# Patient Record
Sex: Female | Born: 1990 | Hispanic: Yes | Marital: Single | Smoking: Never smoker
Health system: Southern US, Community
[De-identification: ages and names within clinical notes are randomized; demographics above are authoritative.]

## PROBLEM LIST (undated history)

## (undated) DIAGNOSIS — Z789 Other specified health status: Secondary | ICD-10-CM

---

## 2017-05-28 ENCOUNTER — Other Ambulatory Visit: Payer: Self-pay | Admitting: Advanced Practice Midwife

## 2017-05-28 DIAGNOSIS — Z369 Encounter for antenatal screening, unspecified: Secondary | ICD-10-CM

## 2017-06-27 ENCOUNTER — Ambulatory Visit
Admission: RE | Admit: 2017-06-27 | Discharge: 2017-06-27 | Disposition: A | Discharge status: Home / Self Care | Payer: Medicaid Other | Source: Ambulatory Visit | Attending: Obstetrics and Gynecology | Admitting: Obstetrics and Gynecology

## 2017-06-27 ENCOUNTER — Ambulatory Visit (HOSPITAL_BASED_OUTPATIENT_CLINIC_OR_DEPARTMENT_OTHER)
Admission: RE | Admit: 2017-06-27 | Discharge: 2017-06-27 | Disposition: A | Discharge status: Home / Self Care | Payer: Medicaid Other | Source: Ambulatory Visit | Attending: Obstetrics and Gynecology | Admitting: Obstetrics and Gynecology

## 2017-06-27 DIAGNOSIS — Z369 Encounter for antenatal screening, unspecified: Secondary | ICD-10-CM

## 2017-06-27 DIAGNOSIS — Z363 Encounter for antenatal screening for malformations: Secondary | ICD-10-CM | POA: Diagnosis not present

## 2017-06-27 DIAGNOSIS — Z3A12 12 weeks gestation of pregnancy: Secondary | ICD-10-CM | POA: Diagnosis not present

## 2017-06-27 HISTORY — DX: Other specified health status: Z78.9

## 2017-06-27 NOTE — Progress Notes (Addendum)
Gloria Nicholson, Gloria Nicholson: 30 minutes   Gloria Nicholson  was [facility] to [facility] for genetic counseling to review prenatal screening and testing options.  This note summarizes the information we discussed.    We offered the following routine screening tests for this pregnancy:  First trimester screening, which includes nuchal translucency ultrasound screen and first trimester maternal serum marker screening.  The nuchal translucency has approximately an 80% detection rate for Down syndrome and can be positive for other chromosome abnormalities as well as congenital heart defects.  When combined with a maternal serum marker screening, the detection rate is up to 90% for Down syndrome and up to 97% for trisomy 18.     Maternal serum marker screening, a blood test that measures pregnancy proteins, can provide risk assessments for Down syndrome, trisomy 18, and open neural tube defects (spina bifida, anencephaly). Because it does not directly examine the fetus, it cannot positively diagnose or rule out these problems.  Targeted ultrasound uses high frequency sound waves to create an image of the developing fetus.  An ultrasound is often recommended as a routine means of evaluating the pregnancy.  It is also used to screen for fetal anatomy problems (for example, a heart defect) that might be suggestive of a chromosomal or other abnormality.   Should these screening tests indicate an increased concern, then the following additional testing options would be offered:  The chorionic villus sampling procedure is available for first trimester chromosome analysis.  This involves the withdrawal of a small amount of chorionic villi (tissue from the developing placenta).  Risk of pregnancy loss is estimated to be approximately 1 in 200 to 1 in 100 (0.5 to 1%).  There is approximately a 1% (1 in 100) chance that the CVS chromosome results will be unclear.  Chorionic  villi cannot be tested for neural tube defects.     Amniocentesis involves the removal of a small amount of amniotic fluid from the sac surrounding the fetus with the use of a thin needle inserted through the maternal abdomen and uterus.  Ultrasound guidance is used throughout the procedure.  Fetal cells from amniotic fluid are directly evaluated and > 99.5% of chromosome problems and > 98% of open neural tube defects can be detected. This procedure is generally performed after the 15th week of pregnancy.  The main risks to this procedure include complications leading to miscarriage in less than 1 in 200 cases (0.5%).  As another option for information if the pregnancy is suspected to be an an increased chance for certain chromosome conditions, we also reviewed the availability of cell free fetal DNA testing from maternal blood to determine whether or not the baby may have either Down syndrome, trisomy 7513, or trisomy 1318.  This test utilizes a maternal blood sample and DNA sequencing technology to isolate circulating cell free fetal DNA from maternal plasma.  The fetal DNA can then be analyzed for DNA sequences that are derived from the three most common chromosomes involved in aneuploidy, chromosomes 13, 18, and 21.  If the overall amount of DNA is greater than the expected level for any of these chromosomes, aneuploidy is suspected.  While we do not consider it a replacement for invasive testing and karyotype analysis, a negative result from this testing would be reassuring, though not a guarantee of a normal chromosome complement for the baby.  An abnormal result is certainly suggestive of an abnormal chromosome complement, though we would still  recommend CVS or amniocentesis to confirm any findings from this testing.  Cystic Fibrosis and Spinal Muscular Atrophy (SMA) screening were also discussed with the patient. Both conditions are recessive, which means that both parents must be carriers in order to have  a child with the disease.  Cystic fibrosis (CF) is one of the most common genetic conditions in persons of Caucasian ancestry.  This condition occurs in approximately 1 in 2,500 Caucasian persons and results in thickened secretions in the lungs, digestive, and reproductive systems.  For a baby to be at risk for having CF, both of the parents must be carriers for this condition.  Approximately 1 in 4 Caucasian persons is a carrier for CF.  Current carrier testing looks for the most common mutations in the gene for CF and can detect approximately 90% of carriers in the Caucasian population.  This means that the carrier screening can greatly reduce, but cannot eliminate, the chance for an individual to have a child with CF.  If an individual is found to be a carrier for CF, then carrier testing would be available for the partner. As part of [place]'s newborn screening profile, all babies born in the state of [place] will have a two-tier screening process.  Specimens are first tested to determine the concentration of immunoreactive trypsinogen (IRT).  The top 5% of specimens with the highest IRT values then undergo DNA testing using a panel of over 40 common CF mutations. SMA is a neurodegenerative disorder that leads to atrophy of skeletal muscle and overall weakness.  This condition is also more prevalent in the Caucasian population, with 1 in 40-1 in 60 persons being a carrier and 1 in 6,000-1 in 10,000 children being affected.  There are multiple forms of the disease, with some causing death in infancy to other forms with survival into adulthood.  The genetics of SMA is complex, but carrier screening can detect up to 95% of carriers in the Caucasian population.  Similar to CF, a negative result can greatly reduce, but cannot eliminate, the chance to have a child with SMA.  We obtained a detailed family history and pregnancy history.  The family history was reported to be unremarkable for birth  defects, intellectual delays, recurrent pregnancy loss or known chromosome abnormalities.  Ms. Meara stated that this is her third pregnancy.  She reported no complications or exposures that would be expected to increase the risk for birth defects.  After consideration of the options, Ms. Cimino elected to proceed with an ultrasound only. She declined formal first trimester screening as well as carrier testing for CF and SMA.  An ultrasound was performed at the time of the visit.  The gestational age was consistent with 12 weeks.  Fetal anatomy could not be assessed due to early gestational age.  Please refer to the ultrasound report for details of that study.  We scheduled her to return to [facility] for an anatomy ultrasound in the second trimester.  Ms. Kuehnel was encouraged to call with questions or concerns.  We can be contacted at [phone].    Cherly Anderson, MS, CGC  Katrina Stack, MS, CGC performed an integral service incident to the physician's initial service.  I was physically present in the clinical area and was immediately available to render assistance.   Mindel Friscia C Amare Bail

## 2017-08-05 ENCOUNTER — Other Ambulatory Visit: Payer: Self-pay | Admitting: *Deleted

## 2017-08-05 DIAGNOSIS — O99212 Obesity complicating pregnancy, second trimester: Secondary | ICD-10-CM

## 2017-08-08 ENCOUNTER — Other Ambulatory Visit: Payer: Self-pay

## 2017-08-12 ENCOUNTER — Ambulatory Visit
Admission: RE | Admit: 2017-08-12 | Discharge: 2017-08-12 | Disposition: A | Discharge status: Home / Self Care | Payer: Medicaid Other | Source: Ambulatory Visit | Attending: Obstetrics and Gynecology | Admitting: Obstetrics and Gynecology

## 2017-08-12 DIAGNOSIS — E669 Obesity, unspecified: Secondary | ICD-10-CM | POA: Insufficient documentation

## 2017-08-12 DIAGNOSIS — O99212 Obesity complicating pregnancy, second trimester: Secondary | ICD-10-CM | POA: Diagnosis present

## 2017-08-12 DIAGNOSIS — Z3A19 19 weeks gestation of pregnancy: Secondary | ICD-10-CM | POA: Diagnosis not present

## 2017-11-18 ENCOUNTER — Other Ambulatory Visit: Payer: Self-pay | Admitting: *Deleted

## 2017-11-18 DIAGNOSIS — O99213 Obesity complicating pregnancy, third trimester: Secondary | ICD-10-CM

## 2017-11-21 ENCOUNTER — Ambulatory Visit
Admission: RE | Admit: 2017-11-21 | Discharge: 2017-11-21 | Disposition: A | Discharge status: Home / Self Care | Payer: Medicaid Other | Source: Ambulatory Visit | Attending: Obstetrics & Gynecology | Admitting: Obstetrics & Gynecology

## 2017-11-21 DIAGNOSIS — O99213 Obesity complicating pregnancy, third trimester: Secondary | ICD-10-CM | POA: Diagnosis not present

## 2017-11-21 DIAGNOSIS — E669 Obesity, unspecified: Secondary | ICD-10-CM | POA: Diagnosis not present

## 2017-11-21 DIAGNOSIS — Z3A33 33 weeks gestation of pregnancy: Secondary | ICD-10-CM | POA: Diagnosis not present

## 2018-02-06 ENCOUNTER — Ambulatory Visit (INDEPENDENT_AMBULATORY_CARE_PROVIDER_SITE_OTHER): Payer: Medicaid Other | Admitting: Women's Health

## 2018-02-06 ENCOUNTER — Encounter: Payer: Self-pay | Admitting: Women's Health

## 2018-02-06 ENCOUNTER — Encounter (INDEPENDENT_AMBULATORY_CARE_PROVIDER_SITE_OTHER): Payer: Self-pay

## 2018-02-06 DIAGNOSIS — Z3009 Encounter for other general counseling and advice on contraception: Secondary | ICD-10-CM

## 2018-02-06 NOTE — Progress Notes (Signed)
   POSTPARTUM VISIT Patient name: Gloria Nicholson MRN [id number]  Date of birth: [birth year] Chief Complaint:   Postpartum Care  History of Present Illness:   Gloria Nicholson is a 27 y.o. [id number] Hispanic female being seen today for a postpartum visit. This is her first appt with us. She is 6 weeks postpartum following a spontaneous vaginal delivery at 38.6 gestational weeks. Anesthesia: epidural. I have fully reviewed the prenatal and intrapartum course. Pregnancy uncomplicated. [facility], [facility] [place] d/t being closest hospital. Lives in [place]. Wanted to come here for pp care b/c this is closer for her.  Postpartum course has been uncomplicated. Bleeding no bleeding. Bowel function is constipation/hemorrhoids, not trying anything to help. Bladder function is normal.  Patient is not sexually active. Last sexual activity: prior to birth of baby.  Contraception method is wants nexplanon.  [place] Postpartum Depression Screening: negative. Score 0.   Last pap unsure.  Results were unsure .  Patient's last menstrual period was 03/31/2017.  Baby's course has been uncomplicated. Baby is feeding by bottle.  Review of Systems:   Pertinent items are noted in HPI Denies Abnormal vaginal discharge w/ itching/odor/irritation, headaches, visual changes, shortness of breath, chest pain, abdominal pain, severe nausea/vomiting, or problems with urination or bowel movements. Pertinent History Reviewed:  Reviewed past medical,surgical, obstetrical and family history.  Reviewed problem list, medications and allergies. OB History  Gravida Para Term Preterm AB Living  3         3  SAB TAB Ectopic Multiple Live Births               # Outcome Date GA Lbr Len/2nd Weight Sex Delivery Anes PTL Lv  3 Current           2 Gravida           1 Gravida            Physical Assessment:   Vitals:   02/06/18 1016  BP: 100/70  Pulse: 98  Weight: 221 lb (100.2 kg)  Body  mass index is 35.67 kg/m.       Physical Examination:   General appearance: alert, well appearing, and in no distress  Mental status: alert, oriented to person, place, and time  Skin: warm & dry   Cardiovascular: normal heart rate noted   Respiratory: normal respiratory effort, no distress   Breasts: deferred, no complaints   Abdomen: soft, non-tender   Pelvic: by Tonia GhentKatie Woods, SNP VULVA: normal appearing vulva with no masses, tenderness or lesions, VAGINA: normal appearing vagina with normal color and discharge, no lesions, UTERUS: uterus is normal size, shape, consistency and nontender  Rectal: small external non-thrombosed hemorrhoids  Extremities: no edema       No results found for this or any previous visit (from the past 24 hour(s)).  Assessment & Plan:  1) Postpartum exam 2) [facility] wks s[facility] [place] 3) Bottlefeeding 4) Depression screening 5) Contraception counseling, pt prefers IUD, abstinence until insertion  Meds: No orders of the defined types were placed in this encounter.   Follow-up: Return for 1st available for IUD insertion.   No orders of the defined types were placed in this encounter.   Cheral MarkerKimberly R Shelia Magallon CNM, [facility] [place]NP-BC 02/06/2018 11:02 AM

## 2018-02-06 NOTE — Patient Instructions (Addendum)
NO SEX UNTIL AFTER YOU GET YOUR BIRTH CONTROL   Constipation  Drink plenty of fluid, preferably water, throughout the day  Eat foods high in fiber such as fruits, vegetables, and grains  Exercise, such as walking, is a good way to keep your bowels regular  Drink warm fluids, especially warm prune juice, or decaf coffee  Eat a 1/2 cup of real oatmeal (not instant), 1/2 cup applesauce, and 1/2-1 cup warm prune juice every day  If needed, you may take Colace (docusate sodium) stool softener once or twice a day to help keep the stool soft. If you are pregnant, wait until you are out of your first trimester (12-14 weeks of pregnancy)  If you still are having problems with constipation, you may take Miralax once daily as needed to help keep your bowels regular.  If you are pregnant, wait until you are out of your first trimester (12-14 weeks of pregnancy)     Levonorgestrel intrauterine device (IUD) What is this medicine? LEVONORGESTREL IUD (LEE voe nor jes trel) is a contraceptive (birth control) device. The device is placed inside the uterus by a healthcare professional. It is used to prevent pregnancy. This device can also be used to treat heavy bleeding that occurs during your period. This medicine may be used for other purposes; ask your health care provider or pharmacist if you have questions. COMMON BRAND NAME(S): Kyleena, LILETTA, Mirena, Skyla What should I tell my health care provider before I take this medicine? They need to know if you have any of these conditions: -abnormal Pap smear -cancer of the breast, uterus, or cervix -diabetes -endometritis -genital or pelvic infection now or in the past -have more than one sexual partner or your partner has more than one partner -heart disease -history of an ectopic or tubal pregnancy -immune system problems -IUD in place -liver disease or tumor -problems with blood clots or take blood-thinners -seizures -use intravenous  drugs -uterus of unusual shape -vaginal bleeding that has not been explained -an unusual or allergic reaction to levonorgestrel, other hormones, silicone, or polyethylene, medicines, foods, dyes, or preservatives -pregnant or trying to get pregnant -breast-feeding How should I use this medicine? This device is placed inside the uterus by a health care professional. Talk to your pediatrician regarding the use of this medicine in children. Special care may be needed. Overdosage: If you think you have taken too much of this medicine contact a poison control center or emergency room at once. NOTE: This medicine is only for you. Do not share this medicine with others. What if I miss a dose? This does not apply. Depending on the brand of device you have inserted, the device will need to be replaced every 3 to 5 years if you wish to continue using this type of birth control. What may interact with this medicine? Do not take this medicine with any of the following medications: -amprenavir -bosentan -fosamprenavir This medicine may also interact with the following medications: -aprepitant -armodafinil -barbiturate medicines for inducing sleep or treating seizures -bexarotene -boceprevir -griseofulvin -medicines to treat seizures like carbamazepine, ethotoin, felbamate, oxcarbazepine, phenytoin, topiramate -modafinil -pioglitazone -rifabutin -rifampin -rifapentine -some medicines to treat HIV infection like atazanavir, efavirenz, indinavir, lopinavir, nelfinavir, tipranavir, ritonavir -St. John's wort -warfarin This list may not describe all possible interactions. Give your health care provider a list of all the medicines, herbs, non-prescription drugs, or dietary supplements you use. Also tell them if you smoke, drink alcohol, or use illegal drugs. Some items may interact   with your medicine. What should I watch for while using this medicine? Visit your doctor or health care professional  for regular check ups. See your doctor if you or your partner has sexual contact with others, becomes HIV positive, or gets a sexual transmitted disease. This product does not protect you against HIV infection (AIDS) or other sexually transmitted diseases. You can check the placement of the IUD yourself by reaching up to the top of your vagina with clean fingers to feel the threads. Do not pull on the threads. It is a good habit to check placement after each menstrual period. Call your doctor right away if you feel more of the IUD than just the threads or if you cannot feel the threads at all. The IUD may come out by itself. You may become pregnant if the device comes out. If you notice that the IUD has come out use a backup birth control method like condoms and call your health care provider. Using tampons will not change the position of the IUD and are okay to use during your period. This IUD can be safely scanned with magnetic resonance imaging (MRI) only under specific conditions. Before you have an MRI, tell your healthcare provider that you have an IUD in place, and which type of IUD you have in place. What side effects may I notice from receiving this medicine? Side effects that you should report to your doctor or health care professional as soon as possible: -allergic reactions like skin rash, itching or hives, swelling of the face, lips, or tongue -fever, flu-like symptoms -genital sores -high blood pressure -no menstrual period for 6 weeks during use -pain, swelling, warmth in the leg -pelvic pain or tenderness -severe or sudden headache -signs of pregnancy -stomach cramping -sudden shortness of breath -trouble with balance, talking, or walking -unusual vaginal bleeding, discharge -yellowing of the eyes or skin Side effects that usually do not require medical attention (report to your doctor or health care professional if they continue or are bothersome): -acne -breast pain -change  in sex drive or performance -changes in weight -cramping, dizziness, or faintness while the device is being inserted -headache -irregular menstrual bleeding within first 3 to 6 months of use -nausea This list may not describe all possible side effects. Call your doctor for medical advice about side effects. You may report side effects to FDA at 1-800-FDA-1088. Where should I keep my medicine? This does not apply. NOTE: This sheet is a summary. It may not cover all possible information. If you have questions about this medicine, talk to your doctor, pharmacist, or health care provider.  2018 Elsevier/Gold Standard (2016-08-10 14:14:56)  

## 2018-02-11 ENCOUNTER — Encounter: Payer: Self-pay | Admitting: *Deleted

## 2018-02-11 ENCOUNTER — Ambulatory Visit: Payer: Medicaid Other | Admitting: Obstetrics and Gynecology

## 2018-02-24 ENCOUNTER — Encounter: Payer: Self-pay | Admitting: *Deleted

## 2018-02-24 ENCOUNTER — Ambulatory Visit: Payer: Medicaid Other | Admitting: Obstetrics and Gynecology

## 2019-06-28 IMAGING — US US MFM OB FOLLOW-UP
1 series · 13 of 28 positions shown · non-contrast
Comparison: none

PATIENT INFO:

PERFORMED BY:
SERVICE(S) PROVIDED:
INDICATIONS:
33 weeks gestation of pregnancy
Obesity
History of previous pregnancy with growth
restriction
FETAL EVALUATION:
Num Of Fetuses:     1
Fetal Heart         157
Rate(bpm):
Cardiac Activity:   Present
Presentation:       Cephalic
Placenta:           Anterior, No previa
Amniotic Fluid
AFI FV:      Within normal limits
AFI Sum(cm)     %Tile       Largest Pocket(cm)
8.6             8
BIOMETRY:
BPD:      86.1  mm     G. Age:  34w 5d         78  %    CI:        74.35   %    70 - 86
FL/HC:      20.0   %    19.4 -
HC:       317   mm     G. Age:  35w 4d         67  %    HC/AC:      1.09        0.96 -
AC:      291.3  mm     G. Age:  33w 1d         40  %    FL/BPD:     73.5   %    71 - 87
FL:       63.3  mm     G. Age:  32w 5d         20  %    FL/AC:      21.7   %    20 - 24
HUM:        57  mm     G. Age:  33w 1d         48  %
Est. FW:    3937  gm    4 lb 13 oz      40  %
GESTATIONAL AGE:
LMP:           33w 4d        Date:  03/31/17                 EDD:   01/05/18
U/S Today:     34w 0d                                        EDD:   01/02/18
Best:          33w 4d     Det. By:  LMP  (03/31/17)          EDD:   01/05/18
ANATOMY:
Cavum:                 Visualized             Ductal Arch:            Normal appearance
previously
Ventricles:            Normal appearance      Diaphragm:              Normal appearance
Cerebellum:            Visualized             Stomach:                Seen
Posterior Fossa:       Visualized             Abdominal Wall:         Visualized
previously                                     previously
Face:                  Orbits visualized      Cord Vessels:           3 vessels,
previously                                     visualized previously
Lips:                  Visualized             Kidneys:                Normal appearance
Heart:                 4-Chamber view         Bladder:                Seen
appears normal
RVOT:                  Normal appearance      Spine:                  Visualized
LVOT:                  Normal appearance      Upper Extremities:      Visualized
Aortic Arch:           Normal appearance      Lower Extremities:      Visualized

[Series 1: us mfm ob follow-up · 0.26mm/px · 13 of 60 slices shown]
[im 3/60]
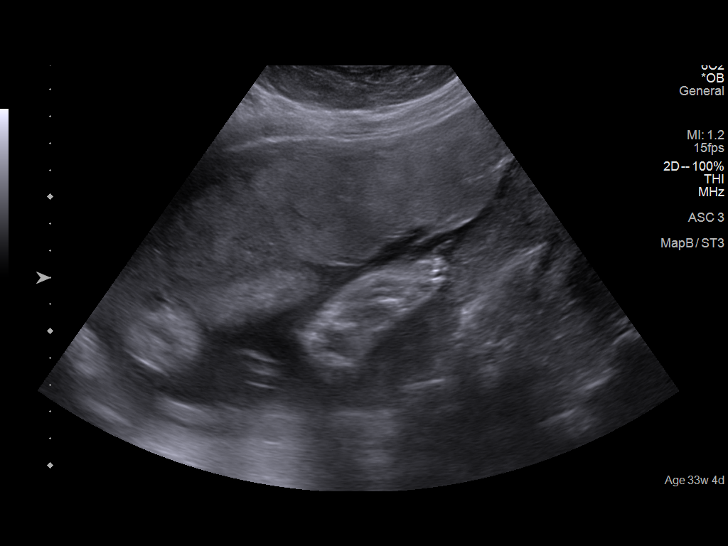
[im 7/60]
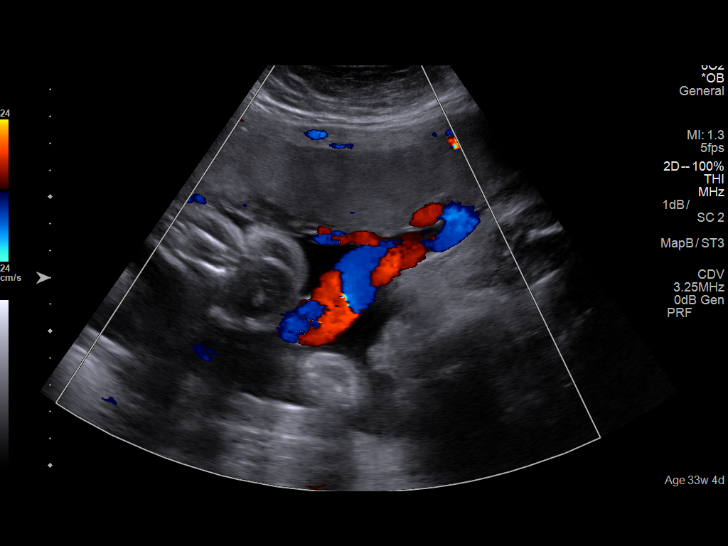
[im 11/60]
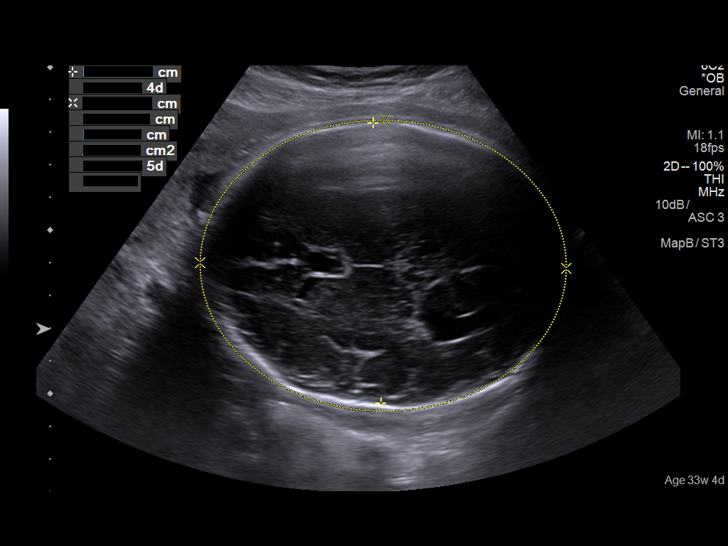
[im 16/60]
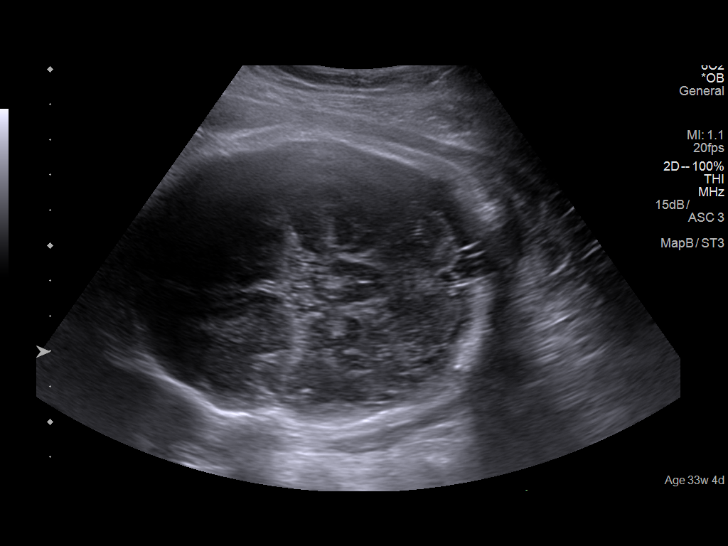
[im 20/60]
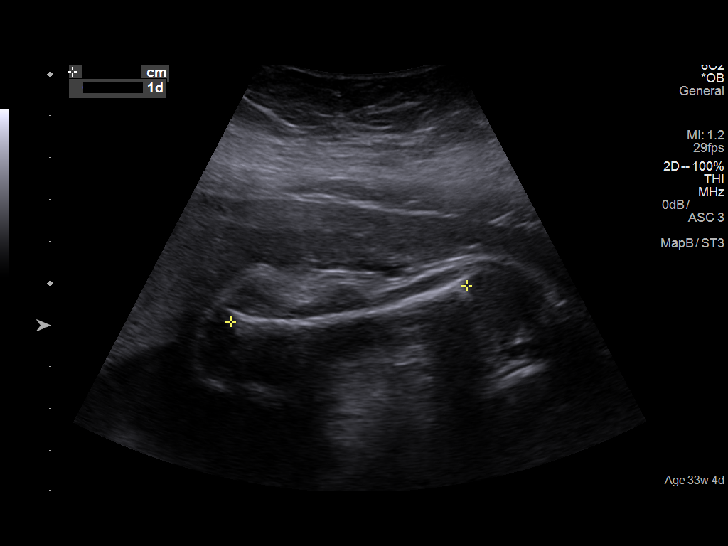
[im 25/60]
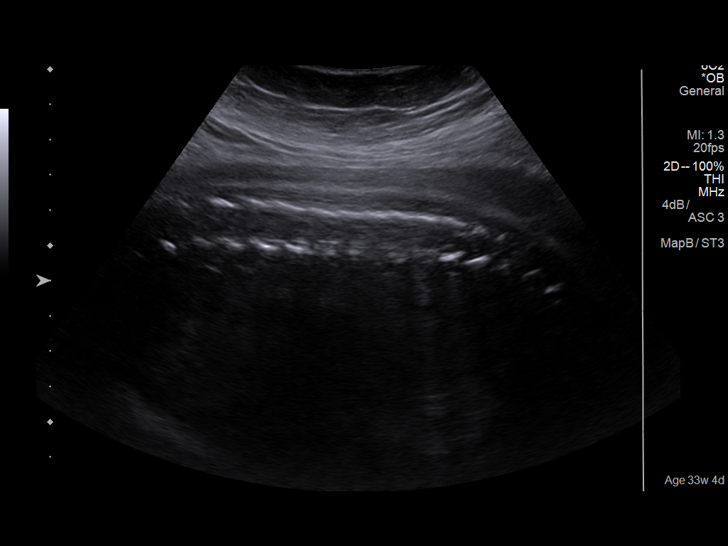
[im 31/60]
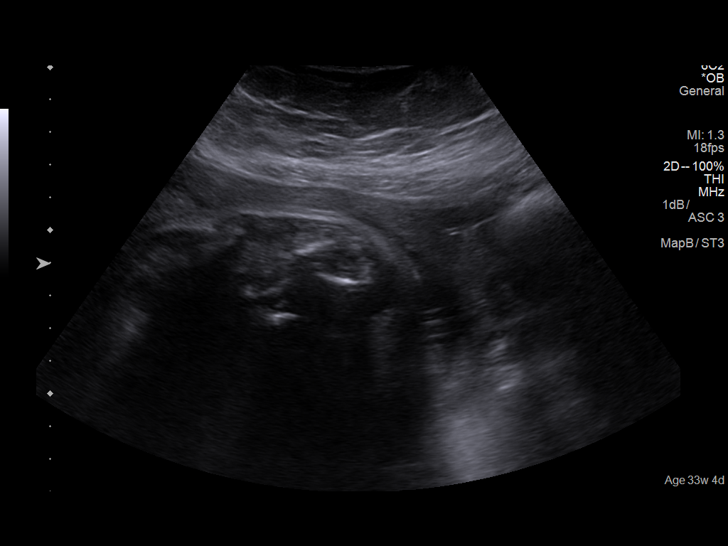
[im 35/60]
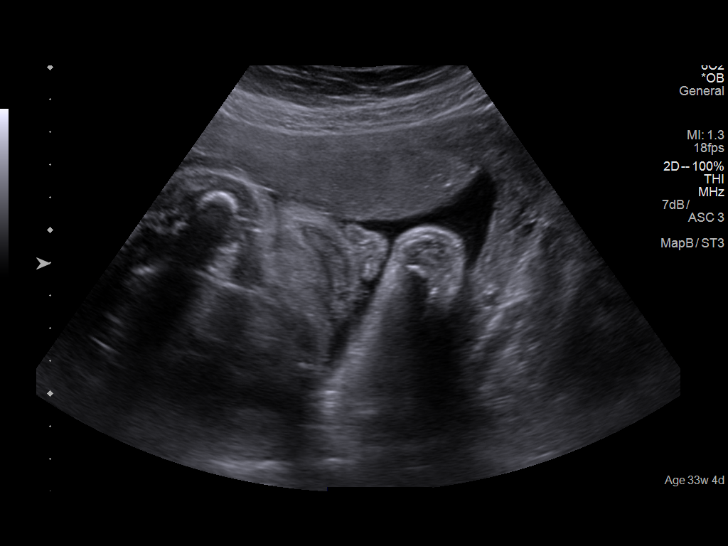
[im 40/60]
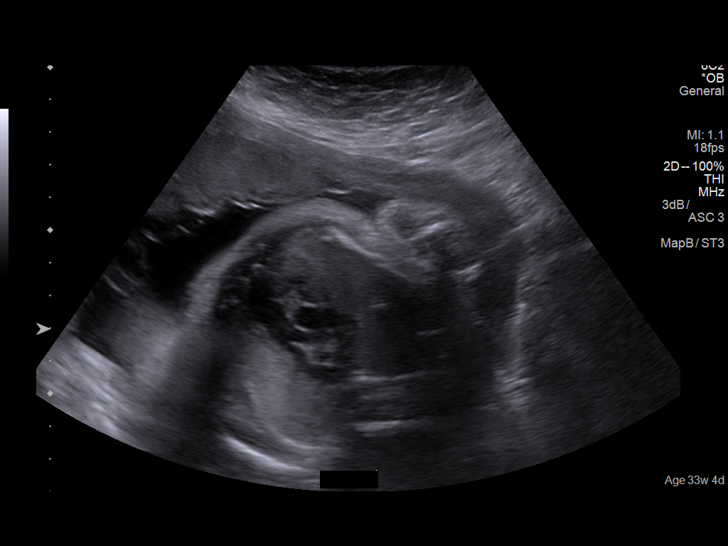
[im 44/60]
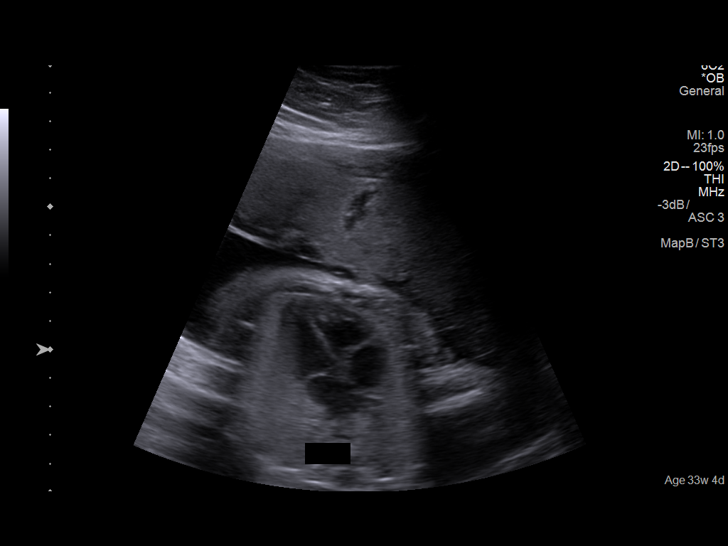
[im 49/60]
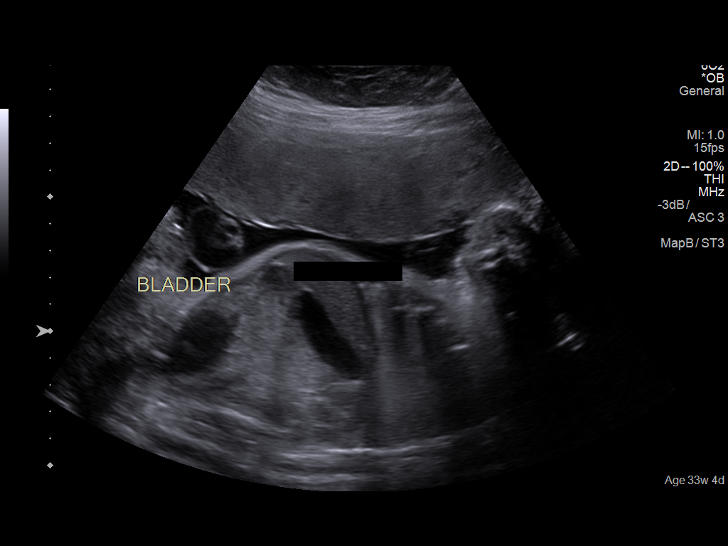
[im 53/60]
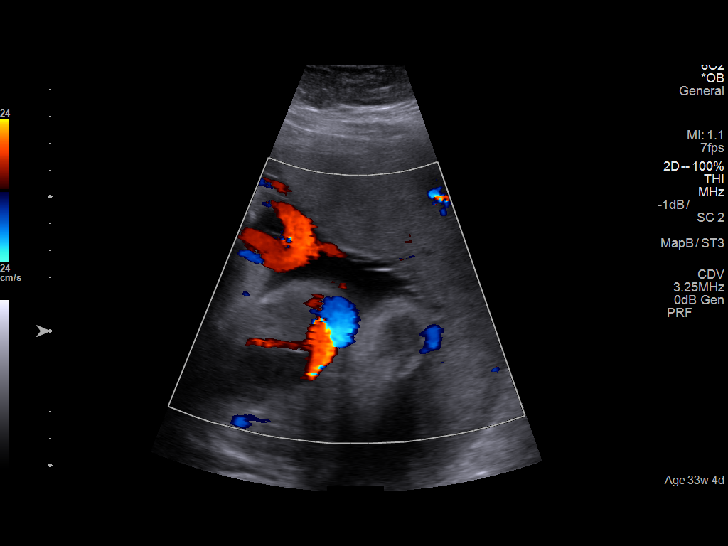
[im 57/60]
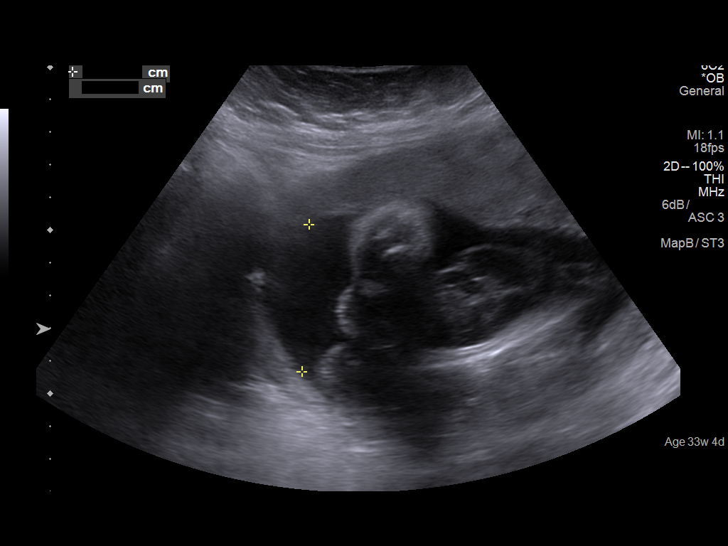

[13 of 28 positions shown; findings below may reference images not displayed]

IMPRESSION: Ultrasound demonstrates a single live pregnancy at 33 [DATE]
weeks. Dating is by LMP consistent with earliest available
ultrasound, performed at [facility] [HOSPITAL] on
06/27/17, with measurements of 12 [DATE] weeks.

The fetal growth is appropriate.  The visualized fetal anatomy
appears normal or was documented as such on prior scan.
The amniotic fluid volume is normal.

## 2023-01-01 LAB — OB RESULTS CONSOLE RPR: RPR: NONREACTIVE

## 2023-01-01 LAB — OB RESULTS CONSOLE ABO/RH: RH Type: POSITIVE

## 2023-01-01 LAB — OB RESULTS CONSOLE HIV ANTIBODY (ROUTINE TESTING): HIV: NONREACTIVE

## 2023-01-01 LAB — OB RESULTS CONSOLE RUBELLA ANTIBODY, IGM: Rubella: IMMUNE

## 2023-01-01 LAB — OB RESULTS CONSOLE HGB/HCT, BLOOD: Hemoglobin: 14.9

## 2023-01-01 LAB — OB RESULTS CONSOLE VARICELLA ZOSTER ANTIBODY, IGG: Varicella: IMMUNE

## 2023-01-16 ENCOUNTER — Ambulatory Visit (INDEPENDENT_AMBULATORY_CARE_PROVIDER_SITE_OTHER): Payer: Medicaid Other

## 2023-01-16 VITALS — BP 113/66 | HR 71 | Ht 66.0 in | Wt 211.4 lb

## 2023-01-16 DIAGNOSIS — Z3481 Encounter for supervision of other normal pregnancy, first trimester: Secondary | ICD-10-CM | POA: Diagnosis not present

## 2023-01-16 DIAGNOSIS — Z3480 Encounter for supervision of other normal pregnancy, unspecified trimester: Secondary | ICD-10-CM | POA: Diagnosis not present

## 2023-01-16 DIAGNOSIS — Z3A01 Less than 8 weeks gestation of pregnancy: Secondary | ICD-10-CM | POA: Diagnosis not present

## 2023-01-16 DIAGNOSIS — O3680X0 Pregnancy with inconclusive fetal viability, not applicable or unspecified: Secondary | ICD-10-CM

## 2023-01-16 MED ORDER — BLOOD PRESSURE KIT DEVI: 1.0000 | 0 refills | Status: DC

## 2023-01-16 MED ORDER — GOJJI WEIGHT SCALE MISC: 1.0000 | 0 refills | Status: DC

## 2023-01-16 NOTE — Progress Notes (Signed)
New OB Intake  I connected with Gloria Nicholson  on 01/16/23 at 10:15 AM EST by in person and verified that I am speaking with the correct person using two identifiers. Nurse is located at [facility] and pt is located at [place].  I discussed the limitations, risks, security and privacy concerns of performing an evaluation and management service by telephone and the availability of in person appointments. I also discussed with the patient that there may be a patient responsible charge related to this service. The patient expressed understanding and agreed to proceed.  I explained I am completing New OB Intake today. We discussed EDD of 09/03/23 that is based on first trimester u/s. Pt is G4/P3003. I reviewed her allergies, medications, Medical/Surgical/OB history, and appropriate screenings. I informed her of [facility] services. [facility] information placed in AVS. Based on history, this is a low risk pregnancy.  There are no problems to display for this patient.   Concerns addressed today  Delivery Plans Plans to deliver at [facility]. Patient given information for [facility] Healthy Baby website for more information about Women's and [place]. Patient is not interested in water birth. Offered upcoming OB visit with CNM to discuss further.  MyChart/Babyscripts MyChart access verified. I explained pt will have some visits in office and some virtually. Babyscripts instructions given and order placed. Patient verifies receipt of registration text/e-mail. Account successfully created and app downloaded.  Blood Pressure Cuff/Weight Scale Blood pressure cuff ordered for patient to pick-up from [employer]. Explained after first prenatal appt pt will check weekly and document in 33. Patient does not have weight scale; order sent to [facility], patient may track weight weekly in Babyscripts.  Anatomy US Explained first scheduled [place] will be around 19 weeks. Dating and viability scan performed  today. Anatomy US TBD.   Labs Discussed Johnsie Cancel genetic screening with patient. Would like both Panorama and Horizon drawn at new OB visit. Routine prenatal labs needed.  COVID Vaccine Patient has had COVID vaccine.   Is patient a CenteringPregnancy candidate?  Not a Candidate Declined due to Group setting Not a candidate due to  If accepted,    Social Determinants of Health Food Insecurity: Patient denies food insecurity. WIC Referral: Patient is interested in referral to [facility].  Transportation: Patient denies transportation needs. Childcare: Discussed no children allowed at ultrasound appointments. Offered childcare services; patient declines childcare services at this time.  Interested in [place]? If yes, send referral.   First visit review I reviewed new OB appt with patient. I explained they will have a provider visit that includes discuss plan of care with provider. Explained pt will be seen by Baltazar Najjar at first visit; encounter routed to appropriate provider. Explained that patient will be seen by pregnancy navigator following visit with provider.   Gloria Lei, RN 01/16/2023  10:31 AM

## 2023-02-20 ENCOUNTER — Ambulatory Visit (INDEPENDENT_AMBULATORY_CARE_PROVIDER_SITE_OTHER): Payer: Medicaid Other | Admitting: Obstetrics

## 2023-02-20 ENCOUNTER — Encounter: Payer: Self-pay | Admitting: Obstetrics

## 2023-02-20 ENCOUNTER — Ambulatory Visit (INDEPENDENT_AMBULATORY_CARE_PROVIDER_SITE_OTHER): Payer: Medicaid Other | Admitting: Licensed Clinical Social Worker

## 2023-02-20 VITALS — BP 115/67 | HR 79 | Wt 214.9 lb

## 2023-02-20 DIAGNOSIS — J301 Allergic rhinitis due to pollen: Secondary | ICD-10-CM

## 2023-02-20 DIAGNOSIS — Z3481 Encounter for supervision of other normal pregnancy, first trimester: Secondary | ICD-10-CM

## 2023-02-20 DIAGNOSIS — Z3480 Encounter for supervision of other normal pregnancy, unspecified trimester: Secondary | ICD-10-CM

## 2023-02-20 DIAGNOSIS — Z3A13 13 weeks gestation of pregnancy: Secondary | ICD-10-CM

## 2023-02-20 MED ORDER — GOJJI WEIGHT SCALE MISC
1.0000 | 0 refills | Status: AC
Start: 2023-02-20 — End: ?

## 2023-02-20 MED ORDER — LORATADINE 10 MG PO TABS: 10.0000 mg | ORAL_TABLET | Freq: Every day | ORAL | 11 refills | Status: AC

## 2023-02-20 MED ORDER — BLOOD PRESSURE KIT DEVI
1.0000 | 0 refills | Status: AC
Start: 2023-02-20 — End: ?

## 2023-02-20 NOTE — Addendum Note (Signed)
Addended by: Natale Milch D on: 02/20/2023 02:47 PM   Modules accepted: Orders

## 2023-02-20 NOTE — Addendum Note (Signed)
Addended by: Natale Milch D on: 02/20/2023 04:00 PM   Modules accepted: Orders

## 2023-02-20 NOTE — Progress Notes (Signed)
Subjective:does    Gloria Nicholson is being seen today for her first obstetrical visit.  This is not a planned pregnancy. She is at 5965w2d gestation. Her obstetrical history is significant for  none . Relationship with FOB: significant other, not living together. Patient does intend to breast feed. Pregnancy history fully reviewed.  The information documented in the HPI was reviewed and verified.  Menstrual History: OB History     Gravida  4   Para  3   Term  3   Preterm      AB      Living  3      SAB      IAB      Ectopic      Multiple      Live Births  3            Patient's last menstrual period was 11/19/2022.    Past Medical History:  Diagnosis Date   Medical history non-contributory     History reviewed. No pertinent surgical history.  (Not in a hospital admission)  No Known Allergies  Social History   Tobacco Use   Smoking status: Never   Smokeless tobacco: Never  Substance Use Topics   Alcohol use: Not Currently    Comment: occ prior to pregnancy    Family History  Problem Relation Age of Onset   Diabetes Father    Hypertension Father      Review of Systems Constitutional: negative for weight loss Gastrointestinal: negative for vomiting Genitourinary:negative for genital lesions and vaginal discharge and dysuria Musculoskeletal:negative for back pain Behavioral/Psych: negative for abusive relationship, depression, illegal drug usage and tobacco use    Objective:    BP 115/67   Pulse 79   Wt 214 lb 14.4 oz (97.5 kg)   LMP 11/19/2022   BMI 34.69 kg/m  General Appearance:    Alert, cooperative, no distress, appears stated age  Head:    Normocephalic, without obvious abnormality, atraumatic  Eyes:    PERRL, conjunctiva/corneas clear, EOM's intact, fundi    benign, both eyes  Ears:    Normal TM's and external ear canals, both ears  Nose:   Nares normal, septum midline, mucosa normal, no drainage    or sinus tenderness  Throat:    Lips, mucosa, and tongue normal; teeth and gums normal  Neck:   Supple, symmetrical, trachea midline, no adenopathy;    thyroid:  no enlargement/tenderness/nodules; no carotid   bruit or JVD  Back:     Symmetric, no curvature, ROM normal, no CVA tenderness  Lungs:     Clear to auscultation bilaterally, respirations unlabored  Chest Wall:    No tenderness or deformity   Heart:    Regular rate and rhythm, S1 and S2 normal, no murmur, rub   or gallop  Breast Exam:    No tenderness, masses, or nipple abnormality  Abdomen:     Soft, non-tender, bowel sounds active all four quadrants,    no masses, no organomegaly  Genitalia:    Normal female without lesion, discharge or tenderness  Extremities:   Extremities normal, atraumatic, no cyanosis or edema  Pulses:   2+ and symmetric all extremities  Skin:   Skin color, texture, turgor normal, no rashes or lesions  Lymph nodes:   Cervical, supraclavicular, and axillary nodes normal  Neurologic:   CNII-XII intact, normal strength, sensation and reflexes    throughout      Lab Review Urine pregnancy test Labs reviewed yes Radiologic  studies reviewed no  Assessment:    Pregnancy at [redacted]w[redacted]d weeks    Plan:   1. Supervision of other normal pregnancy, antepartum Rx: - Panorama Prenatal Test Full Panel - HORIZON Basic Panel - [place] MFM OB DETAIL +14 WK; Future  2. Seasonal allergic rhinitis due to pollen Rx: - loratadine (CLARITIN) 10 MG tablet; Take 1 tablet (10 mg total) by mouth daily.  Dispense: 30 tablet; Refill: 11    Prenatal vitamins.  Counseling provided regarding continued use of seat belts, cessation of alcohol consumption, smoking or use of illicit drugs; infection precautions i.e., influenza/TDAP immunizations, toxoplasmosis,CMV, parvovirus, listeria and varicella; workplace safety, exercise during pregnancy; routine dental care, safe medications, sexual activity, hot tubs, saunas, pools, travel, caffeine use, fish and methlymercury,  potential toxins, hair treatments, varicose veins Weight gain recommendations per IOM guidelines reviewed: underweight/BMI< 18.5--> gain 28 - 40 lbs; normal weight/BMI 18.5 - 24.9--> gain 25 - 35 lbs; overweight/BMI 25 - 29.9--> gain 15 - 25 lbs; obese/BMI >30->gain  11 - 20 lbs Problem list reviewed and updated. FIRST/CF mutation testing/NIPT/QUAD SCREEN/fragile X/Ashkenazi Jewish population testing/Spinal muscular atrophy discussed: requested. Role of ultrasound in pregnancy discussed; fetal survey: requested. Amniocentesis discussed: not indicated.  Meds ordered this encounter  Medications   loratadine (CLARITIN) 10 MG tablet    Sig: Take 1 tablet (10 mg total) by mouth daily.    Dispense:  30 tablet    Refill:  11   Orders Placed This Encounter  Procedures   [place] MFM OB DETAIL +14 WK    Standing Status:   Future    Standing Expiration Date:   02/20/2024    Order Specific Question:   Reason for Exam (SYMPTOM  OR DIAGNOSIS REQUIRED)    Answer:   Anatomy    Order Specific Question:   Preferred Location    Answer:   [facility]   Panorama Prenatal Test Full Panel    ==========Department Information========== ID: [id number] Department:[facility] AT [facility] [address] [place] [address] Dept: [phone] Dept Fax: [phone]     Order Specific Question:   Expected due date (MM/DD/YYYY):    Answer:   08/26/2023    Order Specific Question:   Is this a twin pregnancy? (viable, no vanished twin)    Answer:   No    Order Specific Question:   Is this a surrogate or egg donor pregnancy?    Answer:   No    Order Specific Question:   I want fetal sex included in the report:    Answer:   Yes    Order Specific Question:   Maternal Weight (lbs):    Answer:   16    Order Specific Question:   Which Microdeletion Panel should be ordered?    Answer:   22q11.2 Deletion    Order Specific  Question:   What type of billing?    Answer:   Furniture conservator/restorer Question:   By placing this electronic order I confirm the testing ordered herein is medically necessary and this patient has been informed of the details of the genetic test(s) ordered, including the risks, benefits, and alternatives, and has consented to testing.    Answer:   Yes    Order Specific Question:   Select an order diagnosis: For additional options refer to [email or web address].[email or web address]/  Answer:   Encounter for supervision of other normal pregnancy in first trimester [1509661]   HORIZON Basic Panel    ==========Department Information========== ID: [id number] Department:[facility]ENTER FOR [facility]ALTHCARE AT [facility] [address] [place] [address] Dept: [phone] Dept Fax: [phone]     Order Specific Question:   Specify the name or ID of a valid Horizon Custom Panel:    Answer:   HBASIC    Order Specific Question:   Is patient pregnant?    Answer:   Yes    Order Specific Question:   Ethnicity of patient:    Answer:   Hispanic    Order Specific Question:   Practice ensures that HIPAA consent is obtained and will make available to [facility] upon request?    Answer:   Yes    Order Specific Question:   By placing this electronic order I confirm the testing ordered herein is medically necessary and this patient has been informed of the details of the genetic test(s) ordered, including the risks, benefits, and alternatives, and has consented to testing.    Answer:   Yes    Order Specific Question:   What type of billing?    Answer:   [employer] Specific Question:   Select an order diagnosis: For additional options refer to [email or web address].[email or web address]/    Answer:   Encounter for supervision of other normal pregnancy in second trimester [1660630]    Order Specific Question:    Tay-Sachs add-on test?    Answer:   No    Follow up in 4 weeks.  I have spent a total of 20 minutes of face-to-face time, excluding clinical staff time, reviewing notes and preparing to see patient, ordering tests and/or medications, and counseling the patient.   Brock Bad, MD 02/20/2023 2:43 PM

## 2023-02-20 NOTE — Progress Notes (Addendum)
NOB in office, transfer from [facility]. Pt has no complaints today

## 2023-02-22 LAB — CULTURE, OB URINE

## 2023-02-22 LAB — URINE CULTURE, OB REFLEX

## 2023-02-25 NOTE — BH Specialist Note (Signed)
Integrated Behavioral Health Initial In-Person Visit  MRN: [id number] Name: Gloria Nicholson  Number of Integrated Behavioral Health Clinician visits: 1 Session Start time:   1:30pm Session End time: 1:45pm Total time in minutes: 15 mins at [facility]   Types of Service: General Behavioral Integrated Care (BHI)  Interpretor:No. Interpretor Name and Language: none   Warm Hand Off Completed.        Subjective: Gloria Nicholson is a 32 y.o. female accompanied by Partner/Significant Other Patient was referred by Dr Clearance Coots for new ob intro. Patient reports the following symptoms/concerns: no reported concerns  Duration of problem: n/a; Severity of problem: n/a  Objective: Mood: good and Affect: Appropriate Risk of harm to self or others: No plan to harm self or others  Life Context: Family and Social: lives with partner and children  School/Work: n/a Self-Care: n/a Life Changes: new pregnancy  Patient and/or Family's Strengths/Protective Factors: Concrete supports in place (healthy food, safe environments, etc.)  Goals Addressed: Patient will: Attend scheduled appts  Prioritize rest and reduce stress   Take prenatal vitamins   Progress towards Goals: Ongoing  Interventions: Interventions utilized: Psychoeducation and/or Health Education  Standardized Assessments completed: PHQ 9   Gwyndolyn Saxon, LCSW

## 2023-02-28 LAB — PANORAMA PRENATAL TEST FULL PANEL:PANORAMA TEST PLUS 5 ADDITIONAL MICRODELETIONS: FETAL FRACTION: 6.5

## 2023-02-28 LAB — HORIZON CUSTOM: REPORT SUMMARY: NEGATIVE

## 2023-03-20 ENCOUNTER — Encounter: Payer: Medicaid Other | Admitting: Obstetrics and Gynecology

## 2023-03-27 ENCOUNTER — Encounter: Payer: Medicaid Other | Admitting: Advanced Practice Midwife

## 2023-03-27 DIAGNOSIS — R87619 Unspecified abnormal cytological findings in specimens from cervix uteri: Secondary | ICD-10-CM

## 2023-03-27 HISTORY — DX: Unspecified abnormal cytological findings in specimens from cervix uteri: R87.619

## 2023-04-10 ENCOUNTER — Emergency Department (HOSPITAL_COMMUNITY)
Admission: EM | Admit: 2023-04-10 | Discharge: 2023-04-11 | Disposition: A | Discharge status: Home / Self Care | Payer: Medicaid Other | Attending: Emergency Medicine | Admitting: Emergency Medicine

## 2023-04-10 ENCOUNTER — Other Ambulatory Visit: Payer: Self-pay

## 2023-04-10 ENCOUNTER — Encounter (HOSPITAL_COMMUNITY): Payer: Self-pay | Admitting: Emergency Medicine

## 2023-04-10 DIAGNOSIS — R531 Weakness: Secondary | ICD-10-CM | POA: Diagnosis not present

## 2023-04-10 DIAGNOSIS — Z3A19 19 weeks gestation of pregnancy: Secondary | ICD-10-CM | POA: Diagnosis not present

## 2023-04-10 DIAGNOSIS — O26892 Other specified pregnancy related conditions, second trimester: Secondary | ICD-10-CM | POA: Insufficient documentation

## 2023-04-10 LAB — BASIC METABOLIC PANEL
Anion gap: 8 (ref 5–15)
BUN: 9 mg/dL (ref 6–20)
CO2: 26 mmol/L (ref 22–32)
Calcium: 9.1 mg/dL (ref 8.9–10.3)
Chloride: 101 mmol/L (ref 98–111)
Creatinine, Ser: 0.61 mg/dL (ref 0.44–1.00)
GFR, Estimated: >60 mL/min (ref >60)
Glucose, Bld: 79 mg/dL (ref 70–99)
Potassium: 3.6 mmol/L (ref 3.5–5.1)
Sodium: 135 mmol/L (ref 135–145)

## 2023-04-10 LAB — URINALYSIS, ROUTINE W REFLEX MICROSCOPIC
Bilirubin Urine: NEGATIVE
Glucose, UA: NEGATIVE mg/dL
Hgb urine dipstick: NEGATIVE
Ketones, ur: 20 mg/dL — AB
Nitrite: NEGATIVE
Protein, ur: NEGATIVE mg/dL
Specific Gravity, Urine: 1.017 (ref 1.005–1.030)
pH: 6 (ref 5.0–8.0)

## 2023-04-10 LAB — CBC
HCT: 37.3 % (ref 36.0–46.0)
Hemoglobin: 12.4 g/dL (ref 12.0–15.0)
MCH: 29.5 pg (ref 26.0–34.0)
MCHC: 33.2 g/dL (ref 30.0–36.0)
MCV: 88.6 fL (ref 80.0–100.0)
Platelets: 218 10*3/uL (ref 150–400)
RBC: 4.21 MIL/uL (ref 3.87–5.11)
RDW: 13.2 % (ref 11.5–15.5)
WBC: 11.6 10*3/uL — ABNORMAL HIGH (ref 4.0–10.5)
nRBC: 0 % (ref 0.0–0.2)

## 2023-04-10 LAB — CBG MONITORING, ED: Glucose-Capillary: 90 mg/dL (ref 70–99)

## 2023-04-10 NOTE — ED Triage Notes (Signed)
Pt c/o feeling weak and tired with poor appetite x 1 week. She is currently [redacted] weeks pregnant. Pt is not sure if she is feeling fetal movement yet. G4P3, pt denies vaginal bleeding/discharge, no pain.

## 2023-04-11 NOTE — ED Provider Notes (Signed)
[place] EMERGENCY DEPARTMENT AT [facility] Provider Note   CSN: [id number] Arrival date & time: 04/10/23  1715     History  Chief Complaint  Patient presents with   Weakness    Gloria Nicholson is a 32 y.o. female.  Presents to the emergency department with complaints of feeling weak, tired and having a poor appetite for 1 week.  No vomiting, no diarrhea.  No abdominal pain, cramping, vaginal bleeding or discharge.  She reports that she is [redacted] weeks pregnant, is concerned because she has not felt movement yet.  She has had headache on and off, no other focal neurologic concerns.       Home Medications Prior to Admission medications   Medication Sig Start Date End Date Taking? Authorizing Provider  Blood Pressure Monitoring (BLOOD PRESSURE KIT) DEVI 1 kit by Does not apply route once a week. 02/20/23   Brock Bad, MD  cholecalciferol (VITAMIN D3) 25 MCG (1000 UNIT) tablet Take 1,000 Units by mouth daily.    [provider]  loratadine (CLARITIN) 10 MG tablet Take 1 tablet (10 mg total) by mouth daily. 02/20/23   Brock Bad, MD  Misc. Devices (GOJJI WEIGHT SCALE) MISC 1 Device by Does not apply route every 30 (thirty) days. 02/20/23   Brock Bad, MD  Prenatal Vit-Fe Fumarate-FA (PRENATAL MULTIVITAMIN) TABS tablet Take 1 tablet by mouth daily at 12 noon.    [provider]      Allergies    Patient has no known allergies.    Review of Systems   Review of Systems  Physical Exam Updated Vital Signs BP (!) 117/51   Pulse 77   Temp 98.2 F (36.8 C)   Resp 18   Ht 5\' 6"  (1.676 m)   Wt 98.4 kg   LMP 11/19/2022   SpO2 100%   BMI 35.02 kg/m  Physical Exam Vitals and nursing note reviewed.  Constitutional:      General: She is not in acute distress.    Appearance: She is well-developed.  HENT:     Head: Normocephalic and atraumatic.     Mouth/Throat:     Mouth: Mucous membranes are moist.  Eyes:     General: Vision  grossly intact. Gaze aligned appropriately.     Extraocular Movements: Extraocular movements intact.     Conjunctiva/sclera: Conjunctivae normal.  Cardiovascular:     Rate and Rhythm: Normal rate and regular rhythm.     Pulses: Normal pulses.     Heart sounds: Normal heart sounds, S1 normal and S2 normal. No murmur heard.    No friction rub. No gallop.  Pulmonary:     Effort: Pulmonary effort is normal. No respiratory distress.     Breath sounds: Normal breath sounds.  Abdominal:     General: Bowel sounds are normal.     Palpations: Abdomen is soft.     Tenderness: There is no abdominal tenderness. There is no guarding or rebound.     Hernia: No hernia is present.  Musculoskeletal:        General: No swelling.     Cervical back: Full passive range of motion without pain, normal range of motion and neck supple. No spinous process tenderness or muscular tenderness. Normal range of motion.     Right lower leg: No edema.     Left lower leg: No edema.  Skin:    General: Skin is warm and dry.     Capillary Refill: Capillary refill  takes less than 2 seconds.     Findings: No ecchymosis, erythema, rash or wound.  Neurological:     General: No focal deficit present.     Mental Status: She is alert and oriented to person, place, and time.     GCS: GCS eye subscore is 4. GCS verbal subscore is 5. GCS motor subscore is 6.     Cranial Nerves: Cranial nerves 2-12 are intact.     Sensory: Sensation is intact.     Motor: Motor function is intact.     Coordination: Coordination is intact.  Psychiatric:        Attention and Perception: Attention normal.        Mood and Affect: Mood normal.        Speech: Speech normal.        Behavior: Behavior normal.     ED Results / Procedures / Treatments   Labs (all labs ordered are listed, but only abnormal results are displayed) Labs Reviewed  CBC - Abnormal; Notable for the following components:      Result Value   WBC 11.6 (*)    All other  components within normal limits  URINALYSIS, ROUTINE W REFLEX MICROSCOPIC - Abnormal; Notable for the following components:   APPearance HAZY (*)    Ketones, ur 20 (*)    Leukocytes,Ua SMALL (*)    Bacteria, UA RARE (*)    All other components within normal limits  BASIC METABOLIC PANEL  CBG MONITORING, ED    EKG EKG Interpretation  Date/Time:  Wednesday Apr 10 2023 18:17:14 EDT Ventricular Rate:  71 PR Interval:  156 QRS Duration: 84 QT Interval:  386 QTC Calculation: 419 R Axis:   88 Text Interpretation: Normal sinus rhythm Low voltage QRS Borderline ECG No previous ECGs available Confirmed by Gilda Crease [phone]) on 04/10/2023 11:58:09 PM  Radiology No results found.  Procedures Procedures    Medications Ordered in ED Medications - No data to display  ED Course/ Medical Decision Making/ A&P                             Medical Decision Making Amount and/or Complexity of Data Reviewed Labs: ordered.   Patient presents with generalized weakness.  Workup has been reassuring.  No anemia, no significant leukocytosis other than a slight increase that would be expected in pregnancy.  Renal function, electrolytes normal.  Urinalysis without signs of infection.  She appears well, vital signs are normal.  Fetal heartbeat found at bedside, reassured mother that there are no concerns for obstetric problems at this time.  She does not need further workup.  She has scheduled follow-up with her OB/GYN.        Final Clinical Impression(s) / ED Diagnoses Final diagnoses:  Weakness    Rx / DC Orders ED Discharge Orders     None         Sherra Kimmons, Canary Brim, MD 04/11/23 [phone]

## 2023-04-15 ENCOUNTER — Encounter: Payer: Self-pay | Admitting: *Deleted

## 2023-04-17 ENCOUNTER — Ambulatory Visit: Payer: Medicaid Other | Attending: Obstetrics

## 2023-04-17 ENCOUNTER — Ambulatory Visit: Payer: Medicaid Other

## 2024-04-01 ENCOUNTER — Ambulatory Visit
Admission: EM | Admit: 2024-04-01 | Discharge: 2024-04-01 | Disposition: A | Discharge status: Home / Self Care | Attending: Nurse Practitioner | Admitting: Nurse Practitioner

## 2024-04-01 DIAGNOSIS — J040 Acute laryngitis: Secondary | ICD-10-CM | POA: Insufficient documentation

## 2024-04-01 DIAGNOSIS — J029 Acute pharyngitis, unspecified: Secondary | ICD-10-CM | POA: Diagnosis present

## 2024-04-01 LAB — POCT RAPID STREP A (OFFICE): Rapid Strep A Screen: NEGATIVE

## 2024-04-01 MED ORDER — CETIRIZINE HCL 10 MG PO TABS: 10.0000 mg | ORAL_TABLET | Freq: Every day | ORAL | 0 refills | Status: AC

## 2024-04-01 NOTE — ED Triage Notes (Signed)
 Pt reports hoarseness since yesterday, congestion and sore throat started this morning.

## 2024-04-01 NOTE — Discharge Instructions (Addendum)
 The rapid strep test was negative.  A throat culture has been ordered.  You will be contacted if the pending test result is positive.  You will also have access to your results via MyChart. Take medication as prescribed. You may take over-the-counter Tylenol or ibuprofen as needed for pain, fever, or general discomfort. Warm salt water gargles 3-4 times daily as needed while throat pain persist.  Also recommend the use of Chloraseptic throat spray or throat lozenges while symptoms persist. Rest your voice while your hoarseness persist.  Avoid excessive talking or anything that may strain your vocal cords. Symptoms should improve over the next 5 to 7 days.  If symptoms fail to improve, or appear to be worsening, you may follow-up in this clinic or with your primary care physician for further evaluation. Follow-up as needed.

## 2024-04-01 NOTE — ED Provider Notes (Signed)
 [facility]    CSN: [id number] Arrival date & time: 04/01/24  1126      History   Chief Complaint No chief complaint on file.   HPI Gloria Nicholson is a 33 y.o. female.   The history is provided by the patient.   Patient presents with a 1 day history of sore throat, nasal congestion, hoarseness, and postnasal drainage.  Patient denies fever, chills, headache, ear pain, cough, abdominal pain, nausea, vomiting, diarrhea, or rash.  Patient reports that she did have nausea, vomiting, and diarrhea at the end of last week, she is wondering if this is related to those symptoms.  States that she has been taking over-the-counter analgesics for symptoms.  Denies history of seasonal allergies.  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 Records requested for follow up from [facility] on 02/22/23  [ ]    Medical history non-contributory     Patient Active Problem List   Diagnosis Date Noted   Supervision of other normal pregnancy, antepartum 01/16/2023    History reviewed. No pertinent surgical history.  OB History     Gravida  4   Para  3   Term  3   Preterm      AB      Living  3      SAB      IAB      Ectopic      Multiple      Live Births  3            Home Medications    Prior to Admission medications   Medication Sig Start Date End Date Taking? Authorizing Provider  cetirizine (ZYRTEC) 10 MG tablet Take 1 tablet (10 mg total) by mouth daily. 04/01/24  Yes Leath-Warren, Belen Bowers, NP  Blood Pressure Monitoring (BLOOD PRESSURE KIT) DEVI 1 kit by Does not apply route once a week. 02/20/23   Gabrielle Joiner, MD  cholecalciferol (VITAMIN D3) 25 MCG (1000 UNIT) tablet Take 1,000 Units by mouth daily.    [provider]  loratadine  (CLARITIN ) 10 MG tablet Take 1 tablet (10 mg total) by mouth daily. 02/20/23   Gabrielle Joiner, MD  Misc. Devices  (GOJJI WEIGHT SCALE) MISC 1 Device by Does not apply route every 30 (thirty) days. 02/20/23   Gabrielle Joiner, MD  Prenatal Vit-Fe Fumarate-FA (PRENATAL MULTIVITAMIN) TABS tablet Take 1 tablet by mouth daily at 12 noon.    [provider]    Family History Family History  Problem Relation Age of Onset   Diabetes Father    Hypertension Father     Social History Social History   Tobacco Use   Smoking status: Never   Smokeless tobacco: Never  Vaping Use   Vaping status: Never Used  Substance Use Topics   Alcohol use: Not Currently    Comment: occ prior to pregnancy   Drug use: No     Allergies   Patient has no known allergies.   Review of Systems Review of Systems Per HPI  Physical Exam Triage Vital Signs ED Triage Vitals  Encounter Vitals Group     BP 04/01/24 1141 109/69     Systolic BP Percentile --      Diastolic BP Percentile --      Pulse Rate 04/01/24 1141 86     Resp 04/01/24 1141 16     Temp 04/01/24  1141 97.8 F (36.6 C)     Temp Source 04/01/24 1141 Oral     SpO2 04/01/24 1141 98 %     Weight --      Height --      Head Circumference --      Peak Flow --      Pain Score 04/01/24 1145 0     Pain Loc --      Pain Education --      Exclude from Growth Chart --    No data found.  Updated Vital Signs BP 109/69 (BP Location: Right Arm)   Pulse 86   Temp 97.8 F (36.6 C) (Oral)   Resp 16   LMP 03/18/2024 (Exact Date)   SpO2 98%   Breastfeeding No   Visual Acuity Right Eye Distance:   Left Eye Distance:   Bilateral Distance:    Right Eye Near:   Left Eye Near:    Bilateral Near:     Physical Exam Vitals and nursing note reviewed.  Constitutional:      General: She is not in acute distress.    Appearance: Normal appearance.  HENT:     Head: Normocephalic.     Right Ear: Tympanic membrane, ear canal and external ear normal.     Left Ear: Tympanic membrane, ear canal and external ear normal.     Nose: Nose normal.     Right  Turbinates: Enlarged and swollen.     Left Turbinates: Enlarged and swollen.     Right Sinus: No maxillary sinus tenderness or frontal sinus tenderness.     Left Sinus: No maxillary sinus tenderness or frontal sinus tenderness.     Mouth/Throat:     Lips: Pink.     Mouth: Mucous membranes are moist.     Pharynx: Posterior oropharyngeal erythema and postnasal drip present. No pharyngeal swelling, oropharyngeal exudate or uvula swelling.     Comments: Cobblestoning present to posterior oropharynx, patient is hoarse Eyes:     Extraocular Movements: Extraocular movements intact.     Conjunctiva/sclera: Conjunctivae normal.     Pupils: Pupils are equal, round, and reactive to light.  Cardiovascular:     Rate and Rhythm: Normal rate and regular rhythm.     Pulses: Normal pulses.     Heart sounds: Normal heart sounds.  Pulmonary:     Effort: Pulmonary effort is normal. No respiratory distress.     Breath sounds: Normal breath sounds. No stridor. No wheezing, rhonchi or rales.  Abdominal:     General: Bowel sounds are normal.     Palpations: Abdomen is soft.     Tenderness: There is no abdominal tenderness.  Musculoskeletal:     Cervical back: Normal range of motion.  Lymphadenopathy:     Cervical: No cervical adenopathy.  Skin:    General: Skin is warm and dry.  Neurological:     General: No focal deficit present.     Mental Status: She is alert and oriented to person, place, and time.  Psychiatric:        Mood and Affect: Mood normal.        Behavior: Behavior normal.      UC Treatments / Results  Labs (all labs ordered are listed, but only abnormal results are displayed) Labs Reviewed  CULTURE, GROUP A STREP [facility])  POCT RAPID STREP A (OFFICE)    EKG   Radiology No results found.  Procedures Procedures (including critical care time)  Medications Ordered in UC  Medications - No data to display  Initial Impression / Assessment and Plan / UC Course  I have reviewed  the triage vital signs and the nursing notes.  Pertinent labs & imaging results that were available during my care of the patient were reviewed by me and considered in my medical decision making (see chart for details).  The rapid strep test was negative, throat culture is pending.  On exam, patient with postnasal drainage and hoarseness.  Hoarseness is most likely a result of the postnasal drainage.  Will start patient on cetirizine 10 mg for postnasal drainage.  Supportive care recommendations were provided and discussed with the patient to include fluids, rest, warm salt water gargles, and voice rest while symptoms persist.  Discussed indications with patient regarding follow-up.  Patient was in agreement with this plan of care and verbalizes understanding.  All questions were answered.  Patient stable for discharge.  Final Clinical Impressions(s) / UC Diagnoses   Final diagnoses:  Acute laryngitis  Sore throat     Discharge Instructions      The rapid strep test was negative.  A throat culture has been ordered.  You will be contacted if the pending test result is positive.  You will also have access to your results via MyChart. Take medication as prescribed. You may take over-the-counter Tylenol or ibuprofen as needed for pain, fever, or general discomfort. Warm salt water gargles 3-4 times daily as needed while throat pain persist.  Also recommend the use of Chloraseptic throat spray or throat lozenges while symptoms persist. Rest your voice while your hoarseness persist.  Avoid excessive talking or anything that may strain your vocal cords. Symptoms should improve over the next 5 to 7 days.  If symptoms fail to improve, or appear to be worsening, you may follow-up in this clinic or with your primary care physician for further evaluation. Follow-up as needed.   ED Prescriptions     Medication Sig Dispense Auth. Provider   cetirizine (ZYRTEC) 10 MG tablet Take 1 tablet (10 mg total)  by mouth daily. 30 tablet Leath-Warren, Belen Bowers, NP      PDMP not reviewed this encounter.   Hardy Lia, NP 04/01/24 1223

## 2024-04-05 LAB — CULTURE, GROUP A STREP (THRC)

## 2024-04-06 ENCOUNTER — Ambulatory Visit (HOSPITAL_COMMUNITY): Payer: Self-pay
